# Patient Record
Sex: Male | Born: 1995 | Race: Black or African American | Hispanic: No | Marital: Single | Smoking: Never smoker
Health system: Southern US, Community
[De-identification: ages and names within clinical notes are randomized; demographics above are authoritative.]

## PROBLEM LIST (undated history)

## (undated) DIAGNOSIS — L03039 Cellulitis of unspecified toe: Secondary | ICD-10-CM

## (undated) DIAGNOSIS — L02429 Furuncle of limb, unspecified: Secondary | ICD-10-CM

## (undated) HISTORY — DX: Furuncle of limb, unspecified: L02.429

## (undated) HISTORY — DX: Cellulitis of unspecified toe: L03.039

---
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
## 2017-03-16 ENCOUNTER — Encounter: Payer: Self-pay | Admitting: Emergency Medicine

## 2017-03-16 ENCOUNTER — Other Ambulatory Visit: Payer: Self-pay

## 2017-03-16 ENCOUNTER — Emergency Department
Admission: EM | Admit: 2017-03-16 | Discharge: 2017-03-16 | Disposition: A | Discharge status: Home / Self Care | Payer: Self-pay | Attending: Emergency Medicine | Admitting: Emergency Medicine

## 2017-03-16 DIAGNOSIS — Z79899 Other long term (current) drug therapy: Secondary | ICD-10-CM | POA: Insufficient documentation

## 2017-03-16 DIAGNOSIS — Z041 Encounter for examination and observation following transport accident: Secondary | ICD-10-CM | POA: Insufficient documentation
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
## 2017-11-22 ENCOUNTER — Other Ambulatory Visit: Payer: Self-pay

## 2017-11-22 ENCOUNTER — Emergency Department: Payer: Self-pay

## 2017-11-22 ENCOUNTER — Encounter: Payer: Self-pay | Admitting: Emergency Medicine

## 2017-11-22 ENCOUNTER — Emergency Department
Admission: EM | Admit: 2017-11-22 | Discharge: 2017-11-22 | Disposition: A | Discharge status: Home / Self Care | Payer: Self-pay | Attending: Emergency Medicine | Admitting: Emergency Medicine

## 2017-11-22 DIAGNOSIS — Z79899 Other long term (current) drug therapy: Secondary | ICD-10-CM | POA: Insufficient documentation

## 2017-11-22 DIAGNOSIS — Y9367 Activity, basketball: Secondary | ICD-10-CM | POA: Insufficient documentation

## 2017-11-22 DIAGNOSIS — Y929 Unspecified place or not applicable: Secondary | ICD-10-CM | POA: Insufficient documentation

## 2017-11-22 DIAGNOSIS — Y999 Unspecified external cause status: Secondary | ICD-10-CM | POA: Insufficient documentation

## 2017-11-22 DIAGNOSIS — S93401A Sprain of unspecified ligament of right ankle, initial encounter: Secondary | ICD-10-CM | POA: Insufficient documentation

## 2017-11-22 DIAGNOSIS — X509XXA Other and unspecified overexertion or strenuous movements or postures, initial encounter: Secondary | ICD-10-CM | POA: Insufficient documentation

## 2017-11-22 MED ORDER — IBUPROFEN 600 MG PO TABS: 600.0000 mg | ORAL_TABLET | Freq: Four times a day (QID) | ORAL | 0 refills | Status: AC | PRN

## 2017-11-22 NOTE — ED Provider Notes (Signed)
[facility] Emergency Department Provider Note  ____________________________________________  Time seen: Approximately 2:20 PM  I have reviewed the triage vital signs and the nursing notes.   HISTORY  Chief Complaint Ankle Pain    HPI [name] is a 22 y.o. male that presents emergency department for evaluation of right ankle pain for 1 day.  Patient was playing basketball when he rolled his right ankle.  He has had pain with walking on it since.  He believes he sprained this ankle once before but was never evaluated.   Past Medical History:  Diagnosis Date  . Furuncle of axilla   . Infected nailbed of toe     Patient Active Problem List   Diagnosis Date Noted  . Underweight 08/04/2015  . Weight loss 08/04/2015  . Encounter for well child visit with abnormal findings 08/04/2015  . 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 Seasonal allergic rhinitis 03/03/2015  . H/O ventricular septal defect 10/07/2012    History reviewed. No pertinent surgical history.  Prior to Admission medications   Medication Sig Start Date End Date Taking? Authorizing Provider  AZELEX 20 % cream APPLY EXTERNALLY TWICE A DAY AS DIRECTED 11/12/14   Edwena Felty, MD  fluticasone (FLONASE) 50 MCG/ACT nasal spray Place 2 sprays into both nostrils daily. 03/03/15   Alba Cory, MD  ibuprofen (ADVIL,MOTRIN) 600 MG tablet Take 1 tablet (600 mg total) by mouth every 6 (six) hours as needed. 11/22/17   Enid Derry, PA-C  loratadine (CLARITIN) 10 MG tablet Take 1 tablet (10 mg total) by mouth daily. 03/03/15   Alba Cory, MD    Allergies Shrimp [shellfish allergy]  Family History  Problem Relation Age of Onset  . Cancer Maternal Uncle        prostate  . Diabetes Maternal Grandmother   . Heart disease Maternal Grandfather   . Heart disease Paternal Grandfather     Social History Social History   Tobacco Use  . Smoking status: Never Smoker  . Smokeless tobacco:  Never Used  Substance Use Topics  . Alcohol use: No    Alcohol/week: 0.0 standard drinks  . Drug use: No     Review of Systems  Gastrointestinal:  No nausea, no vomiting.  Musculoskeletal: Positive for ankle pain.  Skin: Negative for rash, abrasions, lacerations, ecchymosis.  ____________________________________________   PHYSICAL EXAM:  VITAL SIGNS: ED Triage Vitals  Enc Vitals Group     BP 11/22/17 1312 115/65     Pulse Rate 11/22/17 1312 (!) 101     Resp 11/22/17 1312 14     Temp 11/22/17 1312 98.3 F (36.8 C)     Temp Source 11/22/17 1312 Oral     SpO2 11/22/17 1312 100 %     Weight 11/22/17 1313 140 lb (63.5 kg)     Height 11/22/17 1313 6' (1.829 m)     Head Circumference --      Peak Flow --      Pain Score 11/22/17 1326 9     Pain Loc --      Pain Edu? --      Excl. in GC? --      Constitutional: Alert and oriented. Well appearing and in no acute distress. Eyes: Conjunctivae are normal. PERRL. EOMI. Head: Atraumatic. ENT:      Ears:      Nose: No congestion/rhinnorhea.      Mouth/Throat: Mucous membranes are moist.  Neck: No stridor. Cardiovascular: Normal rate, regular rhythm.  Good  peripheral circulation. Respiratory: Normal respiratory effort without tachypnea or retractions. Lungs CTAB. Good air entry to the bases with no decreased or absent breath sounds. Musculoskeletal: Full range of motion to all extremities. No gross deformities appreciated.  Swelling to right lateral malleolus.  Full range of motion of ankle. Neurologic:  Normal speech and language. No gross focal neurologic deficits are appreciated.  Skin:  Skin is warm, dry and intact. No rash noted. Psychiatric: Mood and affect are normal. Speech and behavior are normal. Patient exhibits appropriate insight and judgement.   ____________________________________________   LABS (all labs ordered are listed, but only abnormal results are displayed)  Labs Reviewed - No data to  display ____________________________________________  EKG   ____________________________________________  RADIOLOGY Lexine BatonI, Layan Zalenski, personally viewed and evaluated these images (plain radiographs) as part of my medical decision making, as well as reviewing the written report by the radiologist.  Dg Ankle Complete Right  Result Date: 11/22/2017 CLINICAL DATA:  Right ankle pain since a twisting injury yesterday playing basketball. Initial encounter. EXAM: RIGHT ANKLE - COMPLETE 3+ VIEW COMPARISON:  None. FINDINGS: There is lateral soft tissue swelling. No underlying fracture or dislocation is identified. No evidence of arthropathy. IMPRESSION: Lateral soft tissue swelling without underlying acute bony or joint abnormality. Electronically Signed   By: Drusilla Kannerhomas  Dalessio M.D.   On: 11/22/2017 14:00    ____________________________________________    PROCEDURES  Procedure(s) performed:    Procedures    Medications - No data to display   ____________________________________________   INITIAL IMPRESSION / ASSESSMENT AND PLAN / ED COURSE  Pertinent labs & imaging results that were available during my care of the patient were reviewed by me and considered in my medical decision making (see chart for details).  Review of the [place]RS was performed in accordance of the [facility] prior to dispensing any controlled drugs.     Patient's diagnosis is consistent with ankle sprain.  Vital signs and exam are reassuring.  X-ray consistent with soft tissue swelling and no bony abnormality.  Ankle splint was placed.  Crutches were given.  Patient will be discharged home with prescriptions for ibuprofen. Patient is to follow up with orthopedics as directed. Patient is given ED precautions to return to the ED for any worsening or new symptoms.     ____________________________________________  FINAL CLINICAL IMPRESSION(S) / ED DIAGNOSES  Final diagnoses:  Sprain of right ankle, unspecified  ligament, initial encounter      NEW MEDICATIONS STARTED DURING THIS VISIT:  ED Discharge Orders         Ordered    ibuprofen (ADVIL,MOTRIN) 600 MG tablet  Every 6 hours PRN     11/22/17 1441              This chart was dictated using voice recognition software/Dragon. Despite best efforts to proofread, errors can occur which can change the meaning. Any change was purely unintentional.    Enid DerryWagner, Laurel Harnden, PA-C 11/22/17 1502    Merrily Brittleifenbark, Neil, MD 11/22/17 [phone]

## 2017-11-22 NOTE — ED Triage Notes (Signed)
Injured right ankle yesterday while playing basketball.  +1 swelling seen to left ankle.

## 2017-12-11 ENCOUNTER — Encounter: Payer: Self-pay | Admitting: Family Medicine

## 2019-01-19 DIAGNOSIS — Z23 Encounter for immunization: Secondary | ICD-10-CM | POA: Diagnosis not present

## 2019-01-19 DIAGNOSIS — Z20828 Contact with and (suspected) exposure to other viral communicable diseases: Secondary | ICD-10-CM | POA: Diagnosis not present

## 2019-02-17 ENCOUNTER — Other Ambulatory Visit: Payer: Self-pay

## 2019-02-17 DIAGNOSIS — Z20822 Contact with and (suspected) exposure to covid-19: Secondary | ICD-10-CM

## 2019-02-19 LAB — NOVEL CORONAVIRUS, NAA: SARS-CoV-2, NAA: DETECTED — AB

## 2019-08-11 DIAGNOSIS — Z23 Encounter for immunization: Secondary | ICD-10-CM | POA: Diagnosis not present

## 2019-08-11 DIAGNOSIS — S0101XA Laceration without foreign body of scalp, initial encounter: Secondary | ICD-10-CM | POA: Diagnosis not present

## 2019-08-11 DIAGNOSIS — S060X9A Concussion with loss of consciousness of unspecified duration, initial encounter: Secondary | ICD-10-CM | POA: Diagnosis not present

## 2019-08-17 IMAGING — CR DG ANKLE COMPLETE 3+V*R*
1 series · 4 of 4 positions shown · non-contrast
Comparison: None.

CLINICAL DATA: Right ankle pain since a twisting injury yesterday
playing basketball. Initial encounter.

EXAM:
RIGHT ANKLE - COMPLETE 3+ VIEW

[Series 1: dg ankle complete right · 0.14mm/px · 4 of 4 slices shown]
[im 1/4]
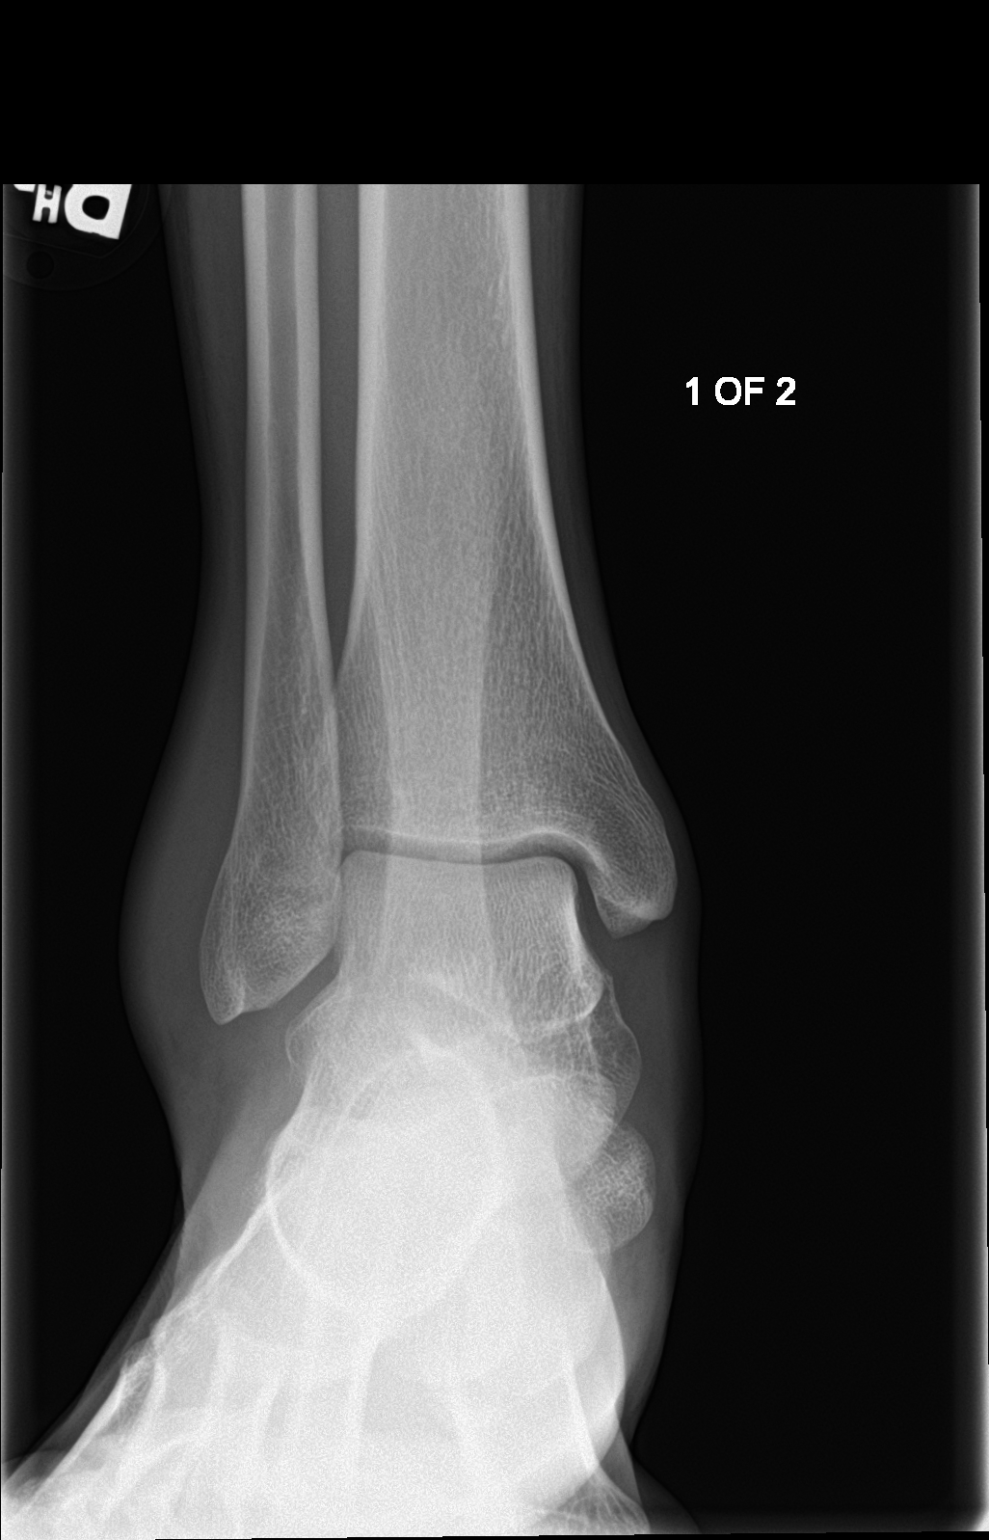
[im 2/4]
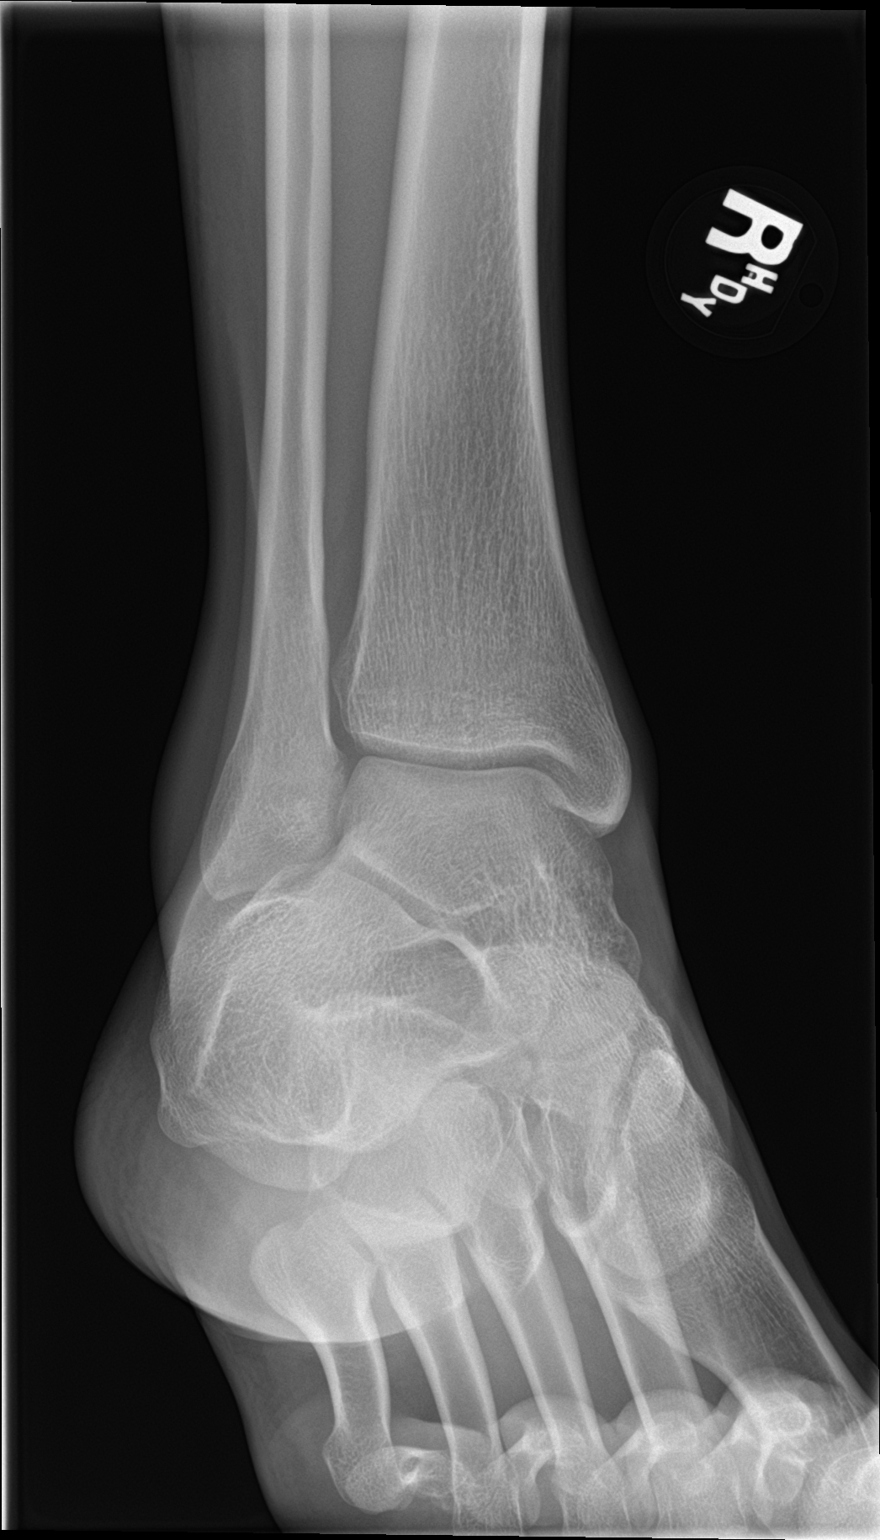
[im 3/4]
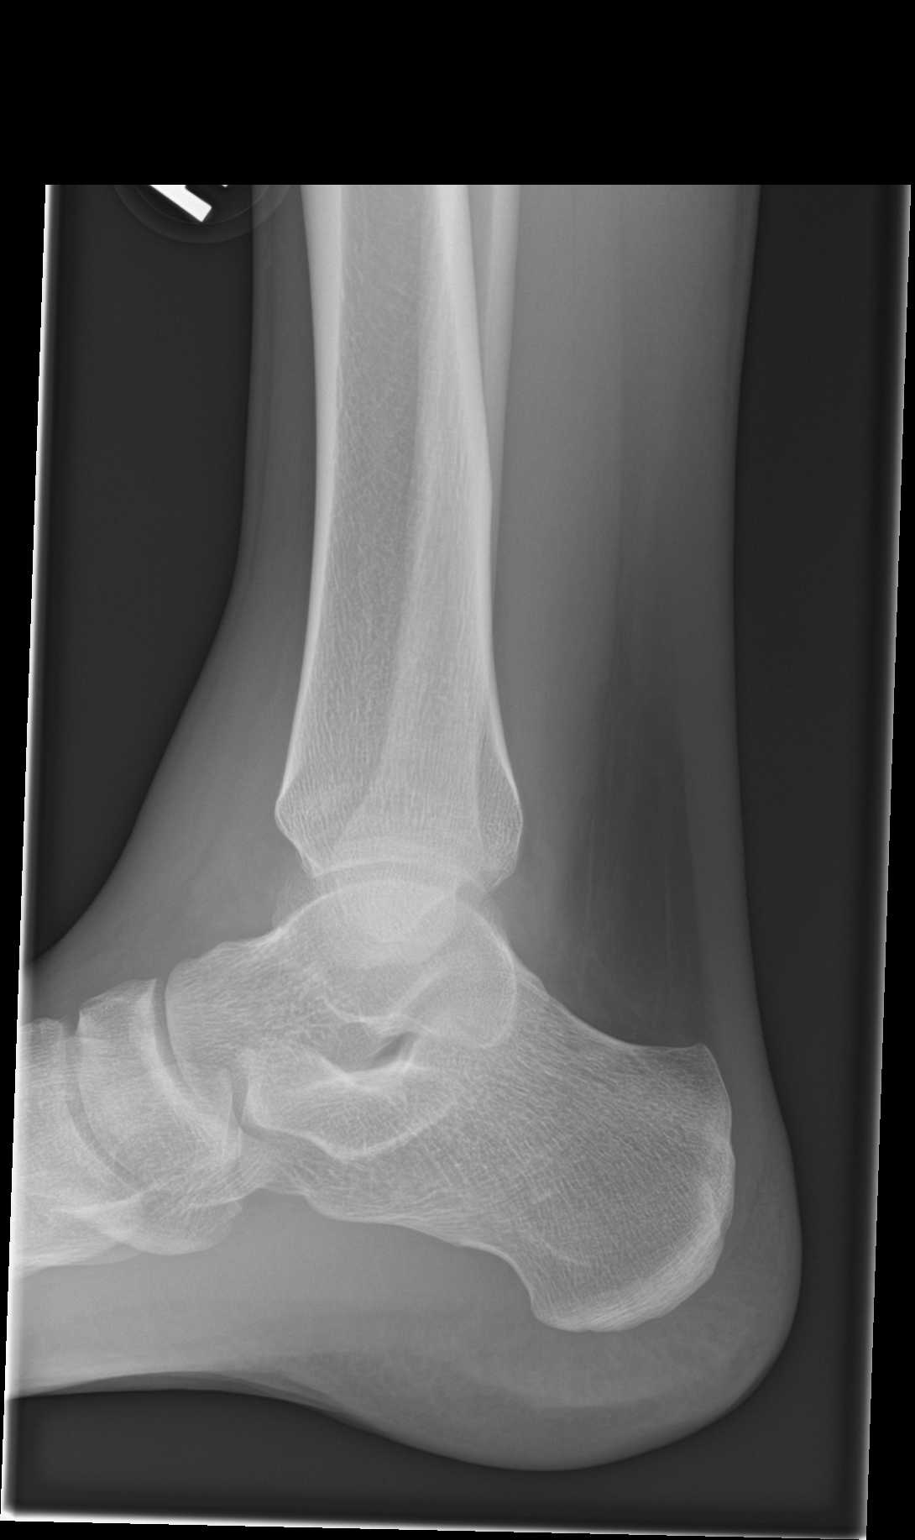
[im 4/4]
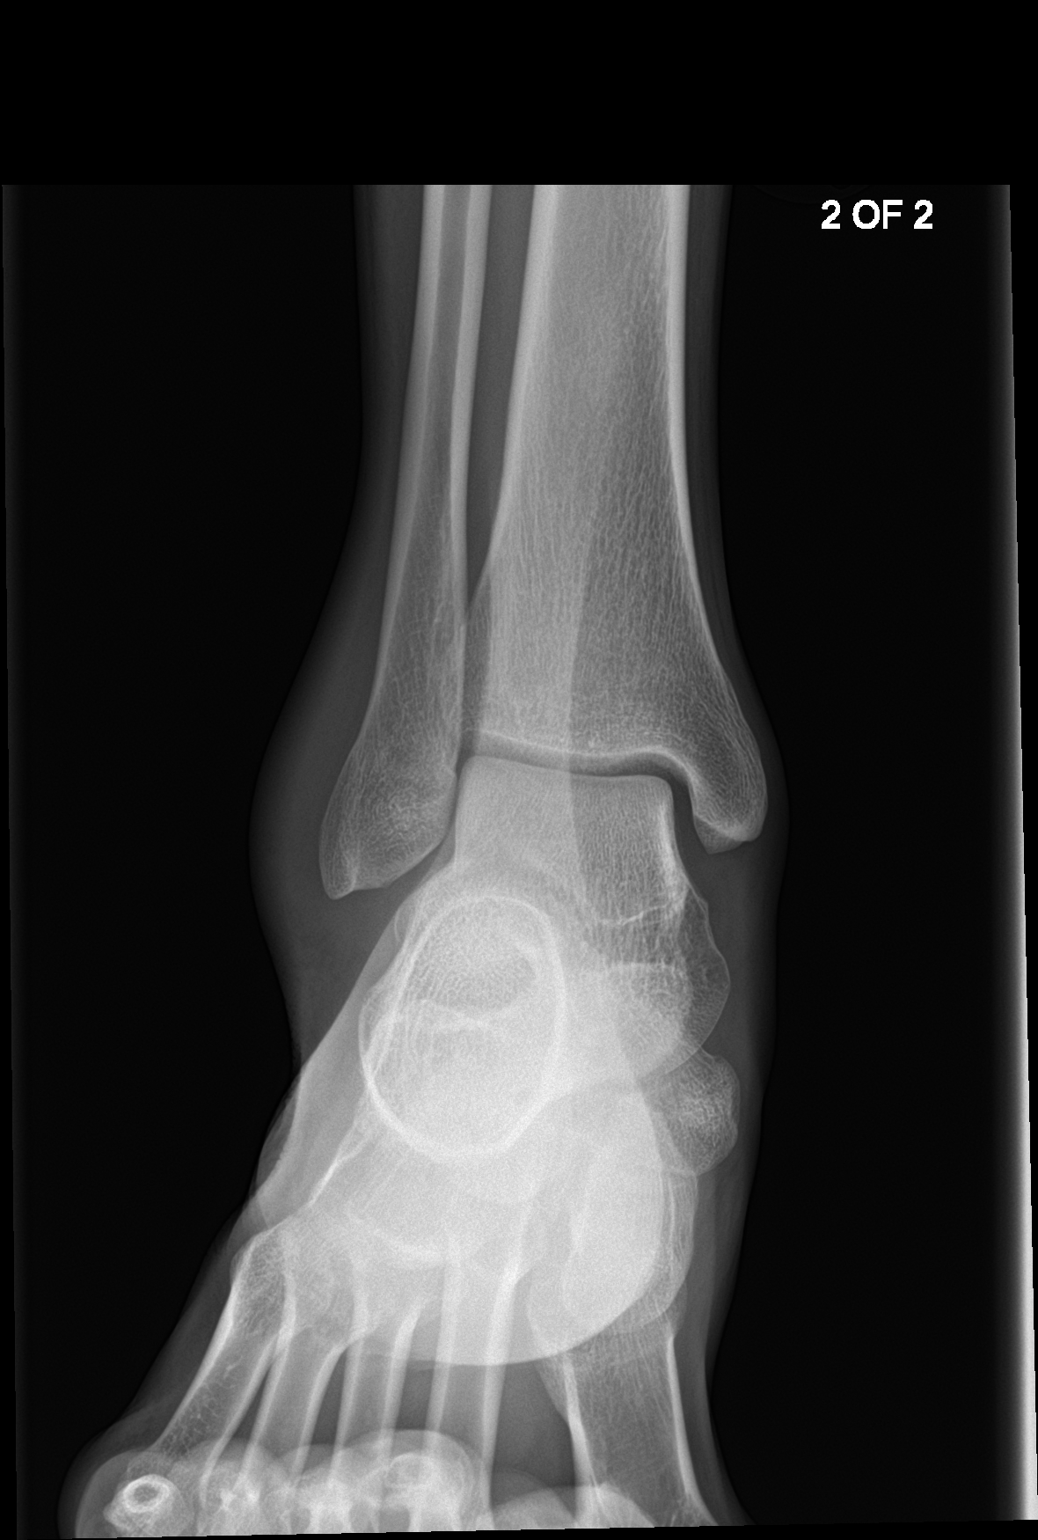

[4 of 4 positions shown; findings below may reference images not displayed]

FINDINGS: There is lateral soft tissue swelling. No underlying fracture or
dislocation is identified. No evidence of arthropathy.
IMPRESSION: Lateral soft tissue swelling without underlying acute bony or joint
abnormality.

## 2019-09-23 DIAGNOSIS — Z419 Encounter for procedure for purposes other than remedying health state, unspecified: Secondary | ICD-10-CM | POA: Diagnosis not present

## 2019-10-24 DIAGNOSIS — Z419 Encounter for procedure for purposes other than remedying health state, unspecified: Secondary | ICD-10-CM | POA: Diagnosis not present

## 2019-11-24 DIAGNOSIS — Z419 Encounter for procedure for purposes other than remedying health state, unspecified: Secondary | ICD-10-CM | POA: Diagnosis not present

## 2019-12-24 DIAGNOSIS — Z419 Encounter for procedure for purposes other than remedying health state, unspecified: Secondary | ICD-10-CM | POA: Diagnosis not present

## 2020-01-24 DIAGNOSIS — Z419 Encounter for procedure for purposes other than remedying health state, unspecified: Secondary | ICD-10-CM | POA: Diagnosis not present

## 2020-02-23 DIAGNOSIS — Z419 Encounter for procedure for purposes other than remedying health state, unspecified: Secondary | ICD-10-CM | POA: Diagnosis not present

## 2020-03-25 DIAGNOSIS — Z419 Encounter for procedure for purposes other than remedying health state, unspecified: Secondary | ICD-10-CM | POA: Diagnosis not present

## 2020-03-30 ENCOUNTER — Other Ambulatory Visit: Payer: Medicaid Other

## 2020-03-30 DIAGNOSIS — Z20822 Contact with and (suspected) exposure to covid-19: Secondary | ICD-10-CM

## 2020-04-03 LAB — NOVEL CORONAVIRUS, NAA: SARS-CoV-2, NAA: NOT DETECTED

## 2020-04-03 LAB — SPECIMEN STATUS REPORT

## 2020-04-25 DIAGNOSIS — Z419 Encounter for procedure for purposes other than remedying health state, unspecified: Secondary | ICD-10-CM | POA: Diagnosis not present

## 2020-05-23 DIAGNOSIS — Z419 Encounter for procedure for purposes other than remedying health state, unspecified: Secondary | ICD-10-CM | POA: Diagnosis not present

## 2020-06-23 DIAGNOSIS — Z419 Encounter for procedure for purposes other than remedying health state, unspecified: Secondary | ICD-10-CM | POA: Diagnosis not present

## 2020-07-23 DIAGNOSIS — Z419 Encounter for procedure for purposes other than remedying health state, unspecified: Secondary | ICD-10-CM | POA: Diagnosis not present

## 2020-08-23 DIAGNOSIS — Z419 Encounter for procedure for purposes other than remedying health state, unspecified: Secondary | ICD-10-CM | POA: Diagnosis not present

## 2020-09-22 DIAGNOSIS — Z419 Encounter for procedure for purposes other than remedying health state, unspecified: Secondary | ICD-10-CM | POA: Diagnosis not present

## 2020-10-23 DIAGNOSIS — Z419 Encounter for procedure for purposes other than remedying health state, unspecified: Secondary | ICD-10-CM | POA: Diagnosis not present

## 2020-11-23 DIAGNOSIS — Z419 Encounter for procedure for purposes other than remedying health state, unspecified: Secondary | ICD-10-CM | POA: Diagnosis not present

## 2020-12-23 DIAGNOSIS — Z419 Encounter for procedure for purposes other than remedying health state, unspecified: Secondary | ICD-10-CM | POA: Diagnosis not present

## 2021-01-23 DIAGNOSIS — Z419 Encounter for procedure for purposes other than remedying health state, unspecified: Secondary | ICD-10-CM | POA: Diagnosis not present

## 2021-02-22 DIAGNOSIS — Z419 Encounter for procedure for purposes other than remedying health state, unspecified: Secondary | ICD-10-CM | POA: Diagnosis not present

## 2021-03-25 DIAGNOSIS — Z419 Encounter for procedure for purposes other than remedying health state, unspecified: Secondary | ICD-10-CM | POA: Diagnosis not present

## 2021-04-25 DIAGNOSIS — Z419 Encounter for procedure for purposes other than remedying health state, unspecified: Secondary | ICD-10-CM | POA: Diagnosis not present

## 2021-05-23 DIAGNOSIS — Z419 Encounter for procedure for purposes other than remedying health state, unspecified: Secondary | ICD-10-CM | POA: Diagnosis not present

## 2021-06-21 ENCOUNTER — Ambulatory Visit: Payer: Medicaid Other | Admitting: Internal Medicine

## 2021-06-23 DIAGNOSIS — Z419 Encounter for procedure for purposes other than remedying health state, unspecified: Secondary | ICD-10-CM | POA: Diagnosis not present

## 2021-07-23 DIAGNOSIS — Z419 Encounter for procedure for purposes other than remedying health state, unspecified: Secondary | ICD-10-CM | POA: Diagnosis not present

## 2021-08-23 DIAGNOSIS — Z419 Encounter for procedure for purposes other than remedying health state, unspecified: Secondary | ICD-10-CM | POA: Diagnosis not present

## 2021-09-22 DIAGNOSIS — Z419 Encounter for procedure for purposes other than remedying health state, unspecified: Secondary | ICD-10-CM | POA: Diagnosis not present

## 2021-10-23 DIAGNOSIS — Z419 Encounter for procedure for purposes other than remedying health state, unspecified: Secondary | ICD-10-CM | POA: Diagnosis not present

## 2021-11-23 DIAGNOSIS — Z419 Encounter for procedure for purposes other than remedying health state, unspecified: Secondary | ICD-10-CM | POA: Diagnosis not present

## 2021-12-23 DIAGNOSIS — Z419 Encounter for procedure for purposes other than remedying health state, unspecified: Secondary | ICD-10-CM | POA: Diagnosis not present

## 2022-01-23 DIAGNOSIS — Z419 Encounter for procedure for purposes other than remedying health state, unspecified: Secondary | ICD-10-CM | POA: Diagnosis not present

## 2022-02-22 DIAGNOSIS — Z419 Encounter for procedure for purposes other than remedying health state, unspecified: Secondary | ICD-10-CM | POA: Diagnosis not present

## 2022-03-25 DIAGNOSIS — Z419 Encounter for procedure for purposes other than remedying health state, unspecified: Secondary | ICD-10-CM | POA: Diagnosis not present

## 2022-04-25 DIAGNOSIS — Z419 Encounter for procedure for purposes other than remedying health state, unspecified: Secondary | ICD-10-CM | POA: Diagnosis not present

## 2022-05-24 DIAGNOSIS — Z419 Encounter for procedure for purposes other than remedying health state, unspecified: Secondary | ICD-10-CM | POA: Diagnosis not present

## 2022-06-24 DIAGNOSIS — Z419 Encounter for procedure for purposes other than remedying health state, unspecified: Secondary | ICD-10-CM | POA: Diagnosis not present

## 2022-07-24 DIAGNOSIS — Z419 Encounter for procedure for purposes other than remedying health state, unspecified: Secondary | ICD-10-CM | POA: Diagnosis not present

## 2022-08-24 DIAGNOSIS — Z419 Encounter for procedure for purposes other than remedying health state, unspecified: Secondary | ICD-10-CM | POA: Diagnosis not present

## 2022-09-23 DIAGNOSIS — Z419 Encounter for procedure for purposes other than remedying health state, unspecified: Secondary | ICD-10-CM | POA: Diagnosis not present

## 2022-10-24 DIAGNOSIS — Z419 Encounter for procedure for purposes other than remedying health state, unspecified: Secondary | ICD-10-CM | POA: Diagnosis not present

## 2022-11-24 DIAGNOSIS — Z419 Encounter for procedure for purposes other than remedying health state, unspecified: Secondary | ICD-10-CM | POA: Diagnosis not present

## 2023-01-24 DIAGNOSIS — Z419 Encounter for procedure for purposes other than remedying health state, unspecified: Secondary | ICD-10-CM | POA: Diagnosis not present

## 2023-02-23 DIAGNOSIS — Z419 Encounter for procedure for purposes other than remedying health state, unspecified: Secondary | ICD-10-CM | POA: Diagnosis not present

## 2023-03-26 DIAGNOSIS — Z419 Encounter for procedure for purposes other than remedying health state, unspecified: Secondary | ICD-10-CM | POA: Diagnosis not present

## 2023-04-26 DIAGNOSIS — Z419 Encounter for procedure for purposes other than remedying health state, unspecified: Secondary | ICD-10-CM | POA: Diagnosis not present

## 2023-05-24 DIAGNOSIS — Z419 Encounter for procedure for purposes other than remedying health state, unspecified: Secondary | ICD-10-CM | POA: Diagnosis not present

## 2023-07-05 DIAGNOSIS — Z419 Encounter for procedure for purposes other than remedying health state, unspecified: Secondary | ICD-10-CM | POA: Diagnosis not present

## 2023-08-04 DIAGNOSIS — Z419 Encounter for procedure for purposes other than remedying health state, unspecified: Secondary | ICD-10-CM | POA: Diagnosis not present

## 2023-09-04 DIAGNOSIS — Z419 Encounter for procedure for purposes other than remedying health state, unspecified: Secondary | ICD-10-CM | POA: Diagnosis not present

## 2023-10-04 DIAGNOSIS — Z419 Encounter for procedure for purposes other than remedying health state, unspecified: Secondary | ICD-10-CM | POA: Diagnosis not present

## 2023-11-04 DIAGNOSIS — Z419 Encounter for procedure for purposes other than remedying health state, unspecified: Secondary | ICD-10-CM | POA: Diagnosis not present

## 2023-12-05 DIAGNOSIS — Z419 Encounter for procedure for purposes other than remedying health state, unspecified: Secondary | ICD-10-CM | POA: Diagnosis not present
